# Patient Record
Sex: Male | Born: 1995 | Hispanic: No | Marital: Single | Smoking: Current every day smoker
Health system: Southern US, Community
[De-identification: ages and names within clinical notes are randomized; demographics above are authoritative.]

## PROBLEM LIST (undated history)

## (undated) DIAGNOSIS — R569 Unspecified convulsions: Secondary | ICD-10-CM

## (undated) DIAGNOSIS — F4310 Post-traumatic stress disorder, unspecified: Secondary | ICD-10-CM

## (undated) DIAGNOSIS — J45909 Unspecified asthma, uncomplicated: Secondary | ICD-10-CM

## (undated) DIAGNOSIS — F319 Bipolar disorder, unspecified: Secondary | ICD-10-CM

## (undated) DIAGNOSIS — F259 Schizoaffective disorder, unspecified: Secondary | ICD-10-CM

## (undated) DIAGNOSIS — I509 Heart failure, unspecified: Secondary | ICD-10-CM

## (undated) DIAGNOSIS — G20A1 Parkinson's disease without dyskinesia, without mention of fluctuations: Secondary | ICD-10-CM

## (undated) HISTORY — PX: ABDOMINAL SURGERY: SHX537

## (undated) HISTORY — DX: Schizoaffective disorder, unspecified: F25.9

## (undated) HISTORY — DX: Parkinson's disease without dyskinesia, without mention of fluctuations: G20.A1

## (undated) HISTORY — DX: Post-traumatic stress disorder, unspecified: F43.10

---
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
## 2019-04-09 ENCOUNTER — Emergency Department (HOSPITAL_COMMUNITY)
Admission: EM | Admit: 2019-04-09 | Discharge: 2019-04-10 | Disposition: A | Discharge status: Home / Self Care | Payer: HRSA Program | Attending: Emergency Medicine | Admitting: Emergency Medicine

## 2019-04-09 ENCOUNTER — Emergency Department (HOSPITAL_COMMUNITY): Payer: HRSA Program

## 2019-04-09 ENCOUNTER — Encounter (HOSPITAL_COMMUNITY): Payer: Self-pay | Admitting: Emergency Medicine

## 2019-04-09 ENCOUNTER — Other Ambulatory Visit: Payer: Self-pay

## 2019-04-09 DIAGNOSIS — F17210 Nicotine dependence, cigarettes, uncomplicated: Secondary | ICD-10-CM | POA: Diagnosis not present

## 2019-04-09 DIAGNOSIS — Z20828 Contact with and (suspected) exposure to other viral communicable diseases: Secondary | ICD-10-CM | POA: Diagnosis not present

## 2019-04-09 DIAGNOSIS — J029 Acute pharyngitis, unspecified: Secondary | ICD-10-CM | POA: Diagnosis present

## 2019-04-09 DIAGNOSIS — J039 Acute tonsillitis, unspecified: Secondary | ICD-10-CM | POA: Diagnosis not present

## 2019-04-09 DIAGNOSIS — R1012 Left upper quadrant pain: Secondary | ICD-10-CM | POA: Insufficient documentation

## 2019-04-09 DIAGNOSIS — J45909 Unspecified asthma, uncomplicated: Secondary | ICD-10-CM | POA: Diagnosis not present

## 2019-04-09 HISTORY — DX: Unspecified asthma, uncomplicated: J45.909

## 2019-04-09 MED ORDER — ACETAMINOPHEN 325 MG PO TABS
650.0000 mg | ORAL_TABLET | Freq: Once | ORAL | Status: AC
  Administered 2019-04-09: 23:00:00 650 mg via ORAL
  Filled 2019-04-09: qty 2

## 2019-04-09 NOTE — ED Triage Notes (Signed)
Pt c/o body aches, fever, sore throat, SOB and headache x 3 days, denies travel

## 2019-04-10 ENCOUNTER — Emergency Department (HOSPITAL_COMMUNITY): Payer: HRSA Program

## 2019-04-10 LAB — URINALYSIS, ROUTINE W REFLEX MICROSCOPIC
Bilirubin Urine: NEGATIVE
Glucose, UA: NEGATIVE mg/dL
Hgb urine dipstick: NEGATIVE
Ketones, ur: NEGATIVE mg/dL
Leukocytes,Ua: NEGATIVE
Nitrite: NEGATIVE
Protein, ur: NEGATIVE mg/dL
Specific Gravity, Urine: 1.039 — ABNORMAL HIGH (ref 1.005–1.030)
pH: 6 (ref 5.0–8.0)

## 2019-04-10 LAB — CBC WITH DIFFERENTIAL/PLATELET
Abs Immature Granulocytes: 0.02 10*3/uL (ref 0.00–0.07)
Basophils Absolute: 0 10*3/uL (ref 0.0–0.1)
Basophils Relative: 0 %
Eosinophils Absolute: 0 10*3/uL (ref 0.0–0.5)
Eosinophils Relative: 0 %
HCT: 44.1 % (ref 39.0–52.0)
Hemoglobin: 14.9 g/dL (ref 13.0–17.0)
Immature Granulocytes: 0 %
Lymphocytes Relative: 14 %
Lymphs Abs: 1.5 10*3/uL (ref 0.7–4.0)
MCH: 30.7 pg (ref 26.0–34.0)
MCHC: 33.8 g/dL (ref 30.0–36.0)
MCV: 90.7 fL (ref 80.0–100.0)
Monocytes Absolute: 1.5 10*3/uL — ABNORMAL HIGH (ref 0.1–1.0)
Monocytes Relative: 14 %
Neutro Abs: 7.5 10*3/uL (ref 1.7–7.7)
Neutrophils Relative %: 72 %
Platelets: 200 10*3/uL (ref 150–400)
RBC: 4.86 MIL/uL (ref 4.22–5.81)
RDW: 13.3 % (ref 11.5–15.5)
WBC: 10.5 10*3/uL (ref 4.0–10.5)
nRBC: 0 % (ref 0.0–0.2)

## 2019-04-10 LAB — COMPREHENSIVE METABOLIC PANEL
ALT: 18 U/L (ref 0–44)
AST: 17 U/L (ref 15–41)
Albumin: 4 g/dL (ref 3.5–5.0)
Alkaline Phosphatase: 53 U/L (ref 38–126)
Anion gap: 8 (ref 5–15)
BUN: 9 mg/dL (ref 6–20)
CO2: 25 mmol/L (ref 22–32)
Calcium: 9.2 mg/dL (ref 8.9–10.3)
Chloride: 101 mmol/L (ref 98–111)
Creatinine, Ser: 1.03 mg/dL (ref 0.61–1.24)
GFR calc Af Amer: >60 mL/min (ref >60)
GFR calc non Af Amer: >60 mL/min (ref >60)
Glucose, Bld: 95 mg/dL (ref 70–99)
Potassium: 3.6 mmol/L (ref 3.5–5.1)
Sodium: 134 mmol/L — ABNORMAL LOW (ref 135–145)
Total Bilirubin: 0.7 mg/dL (ref 0.3–1.2)
Total Protein: 7 g/dL (ref 6.5–8.1)

## 2019-04-10 LAB — GROUP A STREP BY PCR: Group A Strep by PCR: NOT DETECTED

## 2019-04-10 LAB — SARS CORONAVIRUS 2 BY RT PCR (HOSPITAL ORDER, PERFORMED IN ~~LOC~~ HOSPITAL LAB): SARS Coronavirus 2: NEGATIVE

## 2019-04-10 LAB — MONONUCLEOSIS SCREEN: Mono Screen: NEGATIVE

## 2019-04-10 LAB — LACTIC ACID, PLASMA: Lactic Acid, Venous: 0.5 mmol/L (ref 0.5–1.9)

## 2019-04-10 MED ORDER — IOHEXOL 300 MG/ML  SOLN
100.0000 mL | Freq: Once | INTRAMUSCULAR | Status: AC | PRN
  Administered 2019-04-10: 01:00:00 100 mL via INTRAVENOUS

## 2019-04-10 MED ORDER — PENICILLIN G BENZATHINE 1200000 UNIT/2ML IM SUSP
1.2000 10*6.[IU] | Freq: Once | INTRAMUSCULAR | Status: AC
  Administered 2019-04-10: 1.2 10*6.[IU] via INTRAMUSCULAR
  Filled 2019-04-10: qty 2

## 2019-04-10 MED ORDER — SODIUM CHLORIDE 0.9 % IV BOLUS
1000.0000 mL | Freq: Once | INTRAVENOUS | Status: AC
  Administered 2019-04-10: 02:00:00 1000 mL via INTRAVENOUS

## 2019-04-10 MED ORDER — DEXAMETHASONE SODIUM PHOSPHATE 10 MG/ML IJ SOLN
10.0000 mg | Freq: Once | INTRAMUSCULAR | Status: AC
  Administered 2019-04-10: 07:00:00 10 mg via INTRAVENOUS
  Filled 2019-04-10: qty 1

## 2019-04-10 MED ORDER — SODIUM CHLORIDE 0.9 % IV BOLUS
1000.0000 mL | Freq: Once | INTRAVENOUS | Status: AC
  Administered 2019-04-10: 01:00:00 1000 mL via INTRAVENOUS

## 2019-04-10 MED ORDER — IOHEXOL 300 MG/ML  SOLN
100.0000 mL | Freq: Once | INTRAMUSCULAR | Status: AC | PRN
  Administered 2019-04-10: 05:00:00 100 mL via INTRAVENOUS

## 2019-04-10 MED ORDER — CLINDAMYCIN HCL 300 MG PO CAPS: 300.0000 mg | ORAL_CAPSULE | Freq: Three times a day (TID) | ORAL | 0 refills | Status: AC

## 2019-04-10 MED ORDER — IBUPROFEN 800 MG PO TABS
800.0000 mg | ORAL_TABLET | Freq: Once | ORAL | Status: AC
  Administered 2019-04-10: 07:00:00 800 mg via ORAL
  Filled 2019-04-10: qty 1

## 2019-04-10 NOTE — ED Notes (Signed)
Pt ambulatory to waiting room. Pt verbalized understanding of discharge instructions.   

## 2019-04-10 NOTE — Discharge Instructions (Signed)
Keep yourself hydrated.  Use Tylenol or ibuprofen as needed for aches and fever.  Take the antibiotics as prescribed.  Follow-up with the ENT doctor as well as your primary doctor.  Return to the ED with difficulty breathing, difficulty swallowing, any other concerns.

## 2019-04-10 NOTE — ED Notes (Signed)
Pt given ice water to drink

## 2019-04-10 NOTE — ED Provider Notes (Signed)
[facility] EMERGENCY DEPARTMENT Provider Note   CSN: [id number] Arrival date & time: 04/09/19  2216     History   Chief Complaint Chief Complaint  Patient presents with   possible covid    HPI Kirk Lawrence is a 23 y.o. male.     Patient with history of asthma presenting with a 2-day history of body aches, headache, sore throat and myalgias.  States that started with a sore throat right greater than left associated with headache and diffuse body aches.  Did not check his temperature at home.  He was 103 on arrival.  Denies taking any medications at home.  Denies any travel or sick contacts.  Denies any known coronavirus exposures.  States his cough is nonproductive.  Denies any nausea, vomiting, diarrhea.  No chest pain or shortness of breath.  No abdominal pain.  No pain with urination or blood in the urine.  Denies any other medical problems or medical history.  Complains of headache, body aches, fever and sore throat mostly.  He has not traveled outside the country.  The history is provided by the patient.    Past Medical History:  Diagnosis Date   Asthma     There are no active problems to display for this patient.   History reviewed. No pertinent surgical history.      Home Medications    Prior to Admission medications   Not on File    Family History No family history on file.  Social History Social History   Tobacco Use   Smoking status: Current Every Day Smoker    Packs/day: 0.50    Years: 10.00    Pack years: 5.00    Types: Cigarettes   Smokeless tobacco: Never Used  Substance Use Topics   Alcohol use: Yes    Comment: occ   Drug use: Not Currently     Allergies   Patient has no known allergies.   Review of Systems Review of Systems  Constitutional: Positive for activity change, appetite change, chills, fatigue and fever.  HENT: Positive for congestion and sore throat. Negative for rhinorrhea.   Eyes: Negative for visual  disturbance.  Respiratory: Positive for cough.   Cardiovascular: Negative for chest pain.  Gastrointestinal: Negative for abdominal pain, nausea and vomiting.  Genitourinary: Negative for dysuria and hematuria.  Musculoskeletal: Positive for arthralgias, back pain and myalgias. Negative for neck pain.  Skin: Negative for rash.  Neurological: Positive for weakness and headaches.    all other systems are negative except as noted in the HPI and PMH.    Physical Exam Updated Vital Signs BP 138/81 (BP Location: Right Arm)    Pulse (!) 115    Temp (!) 103.1 F (39.5 C) (Oral)    Resp 20    SpO2 97%   Physical Exam Vitals signs and nursing note reviewed.  Constitutional:      General: He is not in acute distress.    Appearance: He is well-developed. He is diaphoretic.  HENT:     Head: Normocephalic and atraumatic.     Left Ear: Tympanic membrane normal.     Ears:     Comments: Cerumen impaction on the right, left TM is normal    Nose: Nose normal. No congestion or rhinorrhea.     Mouth/Throat:     Pharynx: Oropharyngeal exudate and posterior oropharyngeal erythema present.     Comments: Enlarged tonsils bilaterally with exudates, no asymmetry.  Uvula is midline. Eyes:  Conjunctiva/sclera: Conjunctivae normal.     Pupils: Pupils are equal, round, and reactive to light.  Neck:     Musculoskeletal: Normal range of motion and neck supple.     Comments: No meningismus. Cardiovascular:     Rate and Rhythm: Normal rate and regular rhythm.     Heart sounds: Normal heart sounds. No murmur.  Pulmonary:     Effort: Pulmonary effort is normal. No respiratory distress.     Breath sounds: Normal breath sounds.  Abdominal:     Palpations: Abdomen is soft.     Tenderness: There is abdominal tenderness. There is no guarding or rebound.     Comments: Mild diffuse tenderness worse in epigastrium and left upper quadrant  No lower abdominal tenderness, no peritoneal  Musculoskeletal: Normal  range of motion.        General: No tenderness.  Skin:    General: Skin is warm.     Capillary Refill: Capillary refill takes less than 2 seconds.     Findings: No rash.  Neurological:     General: No focal deficit present.     Mental Status: He is alert and oriented to person, place, and time. Mental status is at baseline.     Cranial Nerves: No cranial nerve deficit.     Motor: No abnormal muscle tone.     Coordination: Coordination normal.     Comments: No ataxia on finger to nose bilaterally. No pronator drift. 5/5 strength throughout. CN 2-12 intact.Equal grip strength. Sensation intact.   Psychiatric:        Behavior: Behavior normal.      ED Treatments / Results  Labs (all labs ordered are listed, but only abnormal results are displayed) Labs Reviewed  CBC WITH DIFFERENTIAL/PLATELET - Abnormal; Notable for the following components:      Result Value   Monocytes Absolute 1.5 (*)    All other components within normal limits  COMPREHENSIVE METABOLIC PANEL - Abnormal; Notable for the following components:   Sodium 134 (*)    All other components within normal limits  URINALYSIS, ROUTINE W REFLEX MICROSCOPIC - Abnormal; Notable for the following components:   Specific Gravity, Urine 1.039 (*)    All other components within normal limits  SARS CORONAVIRUS 2 (HOSPITAL ORDER, PERFORMED IN [facility])  CULTURE, BLOOD (ROUTINE X 2)  CULTURE, BLOOD (ROUTINE X 2)  GROUP A STREP BY PCR  URINE CULTURE  LACTIC ACID, PLASMA  MONONUCLEOSIS SCREEN    EKG None  Radiology Ct Soft Tissue Neck W Contrast  Result Date: 04/10/2019 CLINICAL DATA:  Initial evaluation for acute body aches, sore throat, fever. EXAM: CT NECK WITH CONTRAST TECHNIQUE: Multidetector CT imaging of the neck was performed using the standard protocol following the bolus administration of intravenous conThe Physicians Surgery Center [facility] GeneGeoAdvocate Condell Ambulatory Surgery CenKindred Hospital WDakota GastroentFranklin Endoscopy CenChristus DWellbridge Hospital Of ForJim Taliaferro [facility] CeAmg<[id number] HartfordKentu86Northwest Gastroenterology CliChatugeAnamosa CommunitMid Florida SurgeryMagee RBaptist Memorial HospitalMountain View Surgical CenterAmg<Kunesh Eye SurgerySanta Clara VaFreeman HospitRegency HospitaAtlanticare CenteIdaho State HospitaLgh A Golf AstcMaryland Diagnostic And Therapeutic Endo CenFirsthealth Moore Regional [facility] - [facility] CaAmg<MEASUREPalms West Surgery CenAdvenSanford Health DickinsoBeacon Behavioral HCedar City HospAmg Ente82Ascension Our Lady Of VictWilson MedicalFlower HRebound BehSt Catherine HNorthwest SurgProspect Blackstone Valley Surgicare LLC Dba Blackstone Valley SuCts Surgical Associates LLC Dba Cedar TreeSaint Thomas West HCrown Point Surgery CeOcala Fl OCpgi Endoscopy CeEye Surgical Center Of MissGood Samaritan HNeos SurgeryMontcUniversity Hospital MOrlando Health South Seminole HospAmg Mu73 Green HiCommunity Hospital Onaga And St MarysWestbury Community [phone] Conroy[address]SharCaron[address]let [address] Marys Northwest LLC Dba Carondelet Foothills Ch ral cavity within normal limits without discrete mass or loculated fluid collection. No acute inflammatory changes seen about the dentition. Right palatine tonsil enlarged and somewhat hyperenhancing, suggesting acute tonsillitis. No discrete tonsillar or peritonsillar abscess. Adjacent parapharyngeal fat maintained. Minimal mucosal edema within the adjacent right pharynx. Left tonsil relatively normal. Nasopharynx within normal limits. No retropharyngeal collection. Epiglottis normal. Vallecula grossly clear. Remainder of the hypopharynx and supraglottic larynx within normal limits. True cords symmetric and within normal limits. Subglottic airway clear. Salivary glands: Salivary glands including the parotid and submandibular glands are within normal limits. Thyroid: Thyroid normal. Lymph nodes: Mildly  enlarged right level II lymph node measures 18 mm. Additional shotty subcentimeter right-sided cervical lymph nodes noted, slightly more prominent as compared to the contralateral left cervical chain. Findings are likely reactive. No other pathologically enlarged lymph nodes within the neck. Vascular: Normal intravascular enhancement seen throughout the neck. Limited intracranial: Unremarkable. Visualized orbits: Globes and orbital soft tissues within normal limits. Mastoids and visualized paranasal sinuses: Visualized paranasal sinuses are largely clear. Mastoid air cells and middle ear cavities are well pneumatized and free of fluid. Skeleton: No acute osseous finding. No discrete lytic or blastic osseous lesions. Upper chest: Visualized upper chest demonstrates no acute finding. Visualized lung apices are clear. Other: None. IMPRESSION: 1. Asymmetric enlargement with mild hyperenhancement of the right palatine tonsil, suggesting acute tonsillitis. No discrete tonsillar or peritonsillar abscess identified. Associated mild mucosal edema within the adjacent right pharynx likely reflects a degree  of mild pharyngitis. 2. Mildly enlarged right-sided cervical adenopathy, likely reactive. Electronically Signed   By: Rise MuBenjamin  McClintock M.D.   On: 04/10/2019 05:47   Ct Abdomen Pelvis W Contrast  Result Date: 04/10/2019 CLINICAL DATA:  Initial evaluation for acute abdominal pain, fever. EXAM: CT ABDOMEN AND PELVIS WITH CONTRAST TECHNIQUE: Multidetector CT imaging of the abdomen and pelvis was performed using the standard protocol following bolus administration of intravenous contrast. CONTRAST:  100mL OMNIPAQUE IOHEXOL 300 MG/ML  SOLN COMPARISON:  None available. FINDINGS: Lower chest: Mild subsegmental atelectatic changes seen within the visualized lung bases. Visualized lungs are otherwise clear. Hepatobiliary: Liver demonstrates a normal contrast enhanced appearance. Gallbladder within normal limits. No biliary dilatation. Pancreas: Pancreas within normal limits. Apparent subcentimeter cystic focus within the pancreatic tail seen on axial image 20 is most consistent with volume averaging with adjacent peripancreatic fat. No discrete pancreatic lesions or pancreatic ductal dilatation. Spleen: Spleen within normal limits. Adrenals/Urinary Tract: Adrenal glands are within normal limits. Kidneys equal in size with symmetric enhancement. No nephrolithiasis, hydronephrosis, or focal enhancing renal mass. No hydroureter. Partially distended bladder within normal limits. Stomach/Bowel: Stomach within normal limits. No evidence for bowel obstruction. No abnormal wall thickening, mucosal enhancement, or inflammatory fat stranding seen about the bowels. Normal appendix. Vascular/Lymphatic: Normal intravascular enhancement seen throughout the intra-abdominal aorta. Mesenteric vessels patent proximally. No pathologically enlarged intra-abdominal or pelvic lymph nodes. Reproductive: Prostate and seminal vesicles within normal limits. Other: No free air or fluid. Musculoskeletal: No acute osseous finding. No discrete  lytic or blastic osseous lesions. IMPRESSION: No CT evidence for acute intra-abdominal or pelvic process. Electronically Signed   By: Rise MuBenjamin  McClintock M.D.   On: 04/10/2019 01:44   Dg Chest Portable 1 View  Result Date: 04/09/2019 CLINICAL DATA:  Cough and fever. EXAM: PORTABLE CHEST 1 VIEW COMPARISON:  Chest x-ray dated 07/08/2018. FINDINGS: The heart size and mediastinal contours are within normal limits. Both lungs are clear. The visualized skeletal structures are unremarkable. IMPRESSION: No active disease. Electronically Signed   By: Katherine Mantlehristopher  Green M.D.   On: 04/09/2019 23:57    Procedures Procedures (including critical care time)  Medications Ordered in ED Medications  acetaminophen (TYLENOL) tablet 650 mg (650 mg Oral Given 04/09/19 2230)     Initial Impression / Assessment and Plan / ED Course  I have reviewed the triage vital signs and the nursing notes.  Pertinent labs & imaging results that were available during my care of the patient were reviewed by me and considered in my medical decision making (see chart for details).       2 days of headache,  body aches, fever, sore throat and cough.  Febrile but nontoxic.  No meningismus.  Tachycardic Judicious IVF and antipyretics given. CXR negative. Rapid strep is negative though exam concerning for tonsillitis. No obvious PTA.  Bicillin IM given. Labs reassuring with normal lactate. covid negative.  Decadron given. Mono spot negative.   Workup with tonsillitis. No evidence of peritonsillar abscess at this time.  Fever and HR improved. Tolerating PO without vomiting.  Will treat with antibiotics and followup with ENT and PCP. Return precautions discussed.   Alfonso EllisRyan J Cowman was evaluated in Emergency Department on 04/10/2019 for the symptoms described in the history of present illness. He was evaluated in the context of the global COVID-19 pandemic, which necessitated consideration that the patient might be at risk for  infection with the SARS-CoV-2 virus that causes COVID-19. Institutional protocols and algorithms that pertain to the evaluation of patients at risk for COVID-19 are in a state of rapid change based on information released by regulatory bodies including the CDC and federal and state organizations. These policies and algorithms were followed during the patient's care in the ED.  Final Clinical Impressions(s) / ED Diagnoses   Final diagnoses:  Tonsillitis    ED Discharge Orders    None       Omauri Boeve, Jeannett SeniorStephen, MD 04/10/19 0900

## 2019-04-11 LAB — URINE CULTURE: Culture: <10000 — AB

## 2019-04-15 LAB — CULTURE, BLOOD (ROUTINE X 2)
Culture: NO GROWTH
Culture: NO GROWTH
Special Requests: ADEQUATE
Special Requests: ADEQUATE

## 2020-07-13 IMAGING — CT CT ABDOMEN AND PELVIS WITH CONTRAST
2 of 4 series · 16 of 46 positions shown, 18 images · IV contrast (omnipaque)
Comparison: None available.

CLINICAL DATA: Initial evaluation for acute abdominal pain, fever.

EXAM:
CT ABDOMEN AND PELVIS WITH CONTRAST
TECHNIQUE: Multidetector CT imaging of the abdomen and pelvis was performed
using the standard protocol following bolus administration of
intravenous contrast.
CONTRAST:  100mL OMNIPAQUE IOHEXOL 300 MG/ML  SOLN

[Series 2: axial st · axial · 0.82mm/px · z∈[+907,+1347]mm · 13 of 100 slices shown, 15 images]
[im 6/100  soft-tissue]
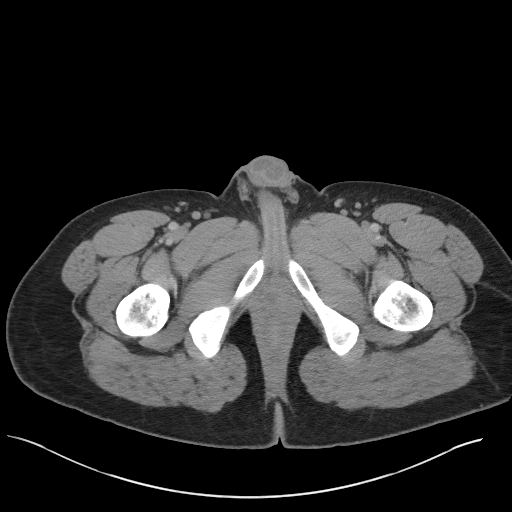
[im 6/100  bone]
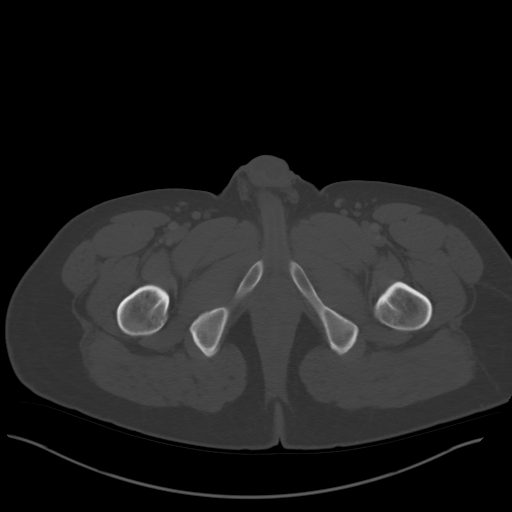
[im 16/100  soft-tissue]
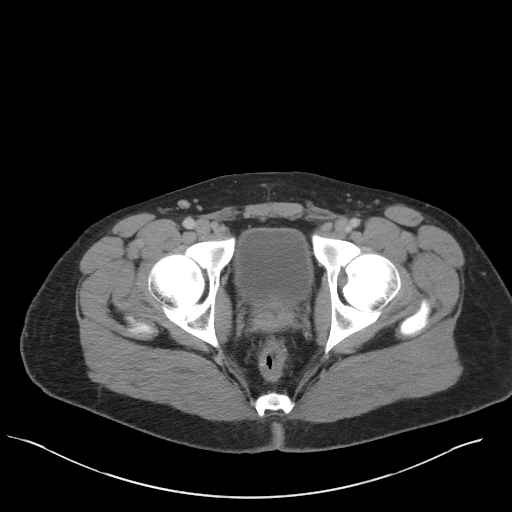
[im 21/100  soft-tissue]
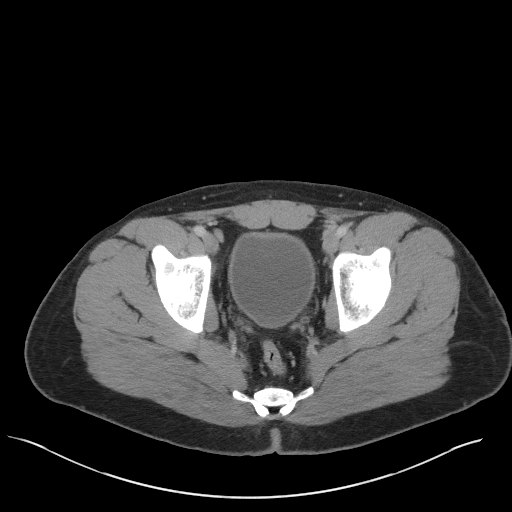
[im 27/100  soft-tissue]
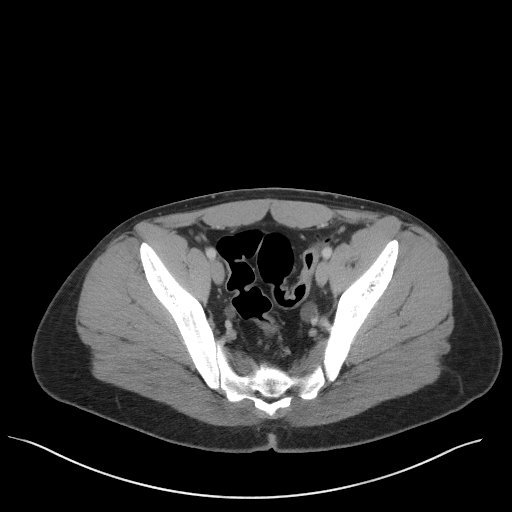
[im 37/100  soft-tissue]
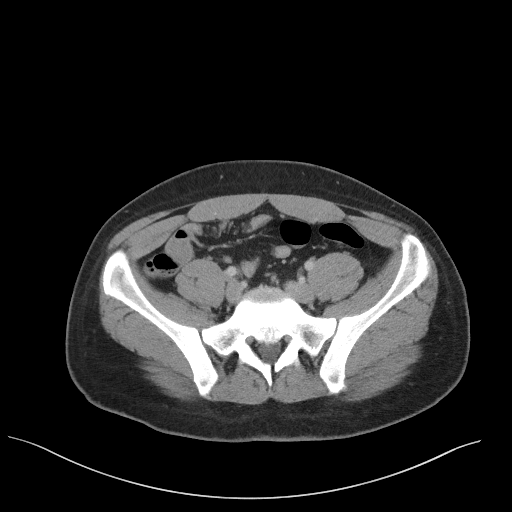
[im 42/100  soft-tissue]
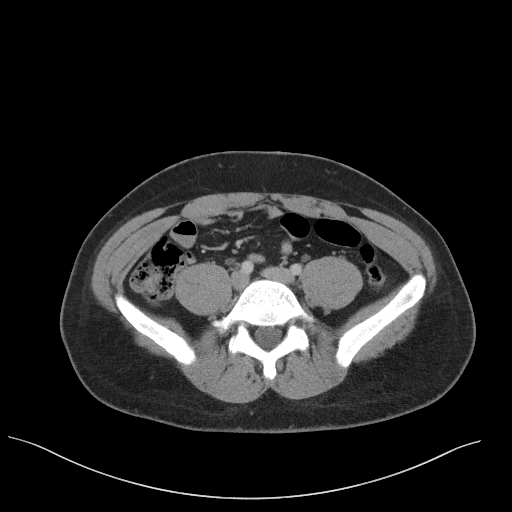
[im 53/100  soft-tissue]
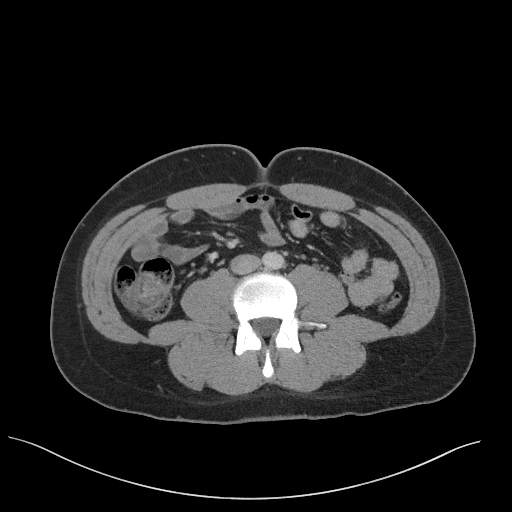
[im 58/100  soft-tissue]
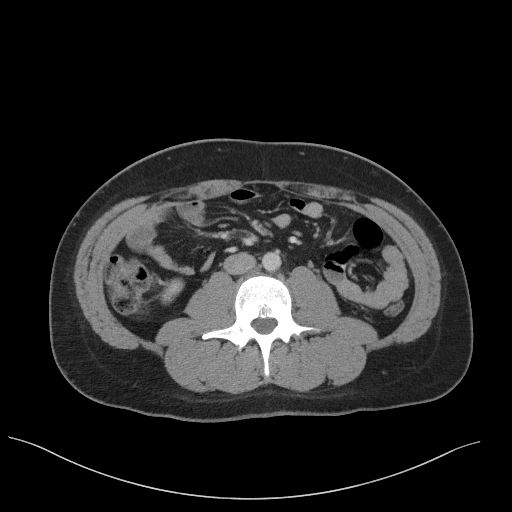
[im 63/100  soft-tissue]
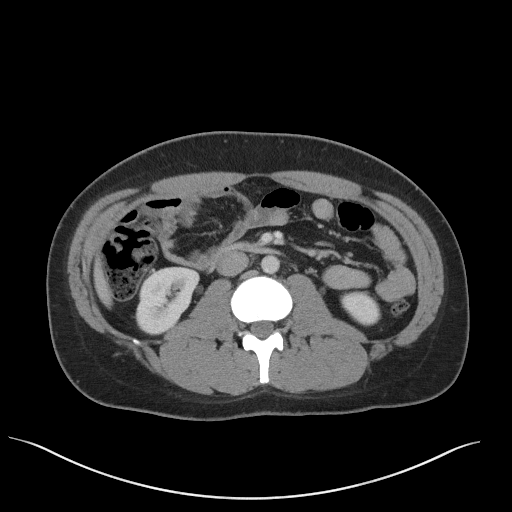
[im 63/100  bone]
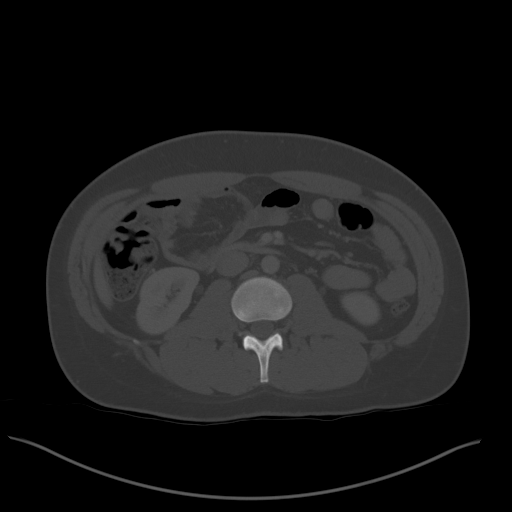
[im 73/100  soft-tissue]
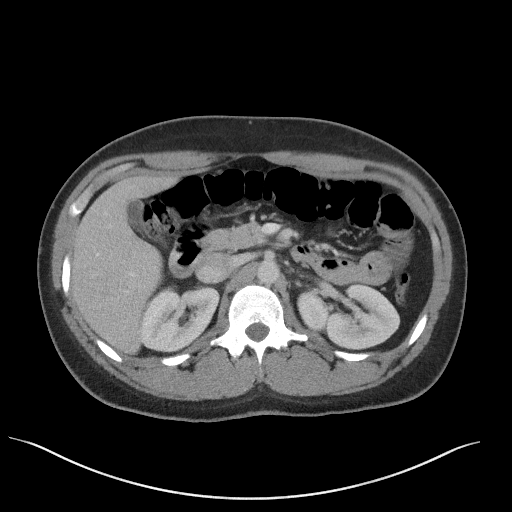
[im 79/100  soft-tissue]
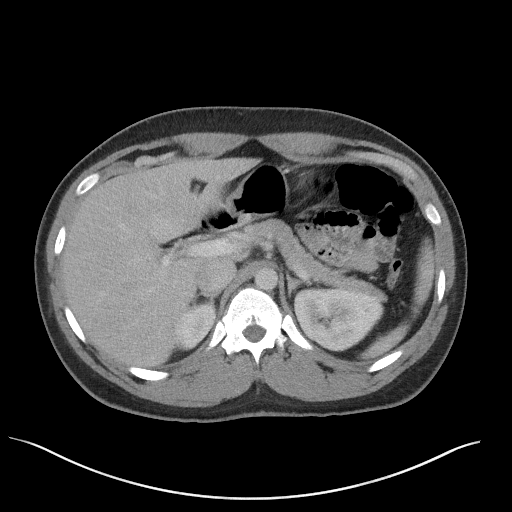
[im 84/100  soft-tissue]
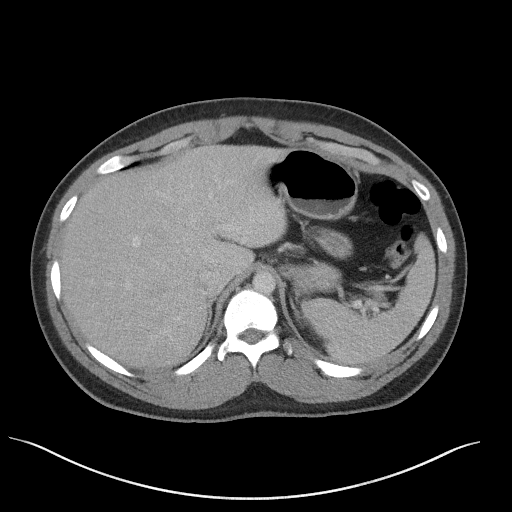
[im 94/100  soft-tissue]
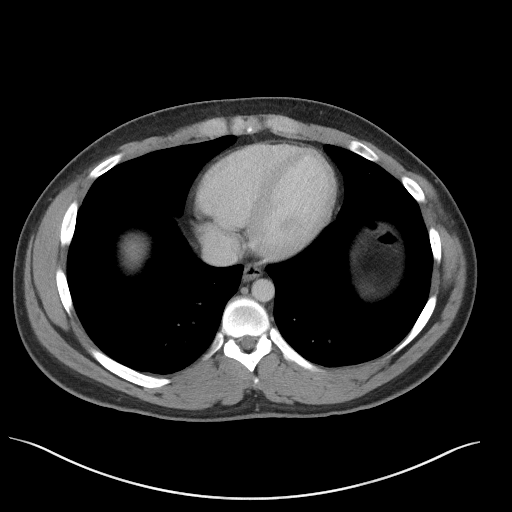

[Series 5: coronal st · coronal · 0.82mm/px · 3 of 100 slices shown]
[im 34/100  soft-tissue]
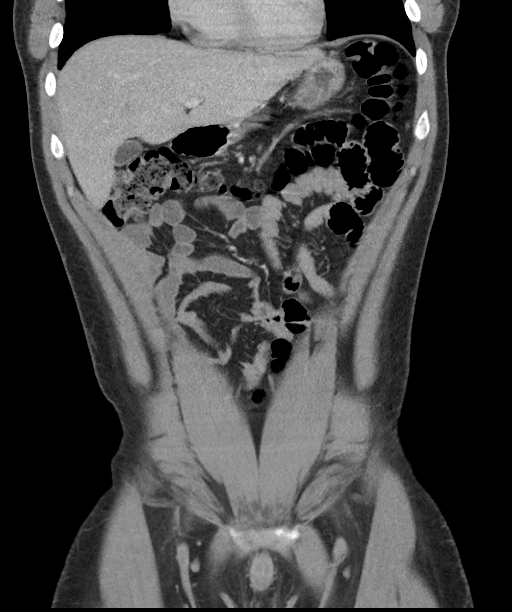
[im 45/100  soft-tissue]
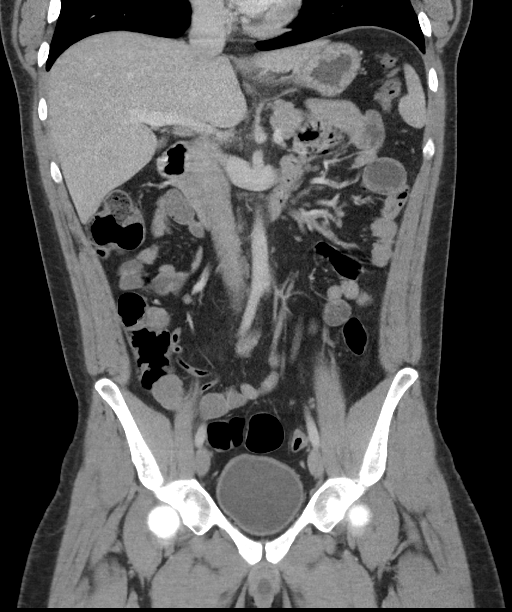
[im 56/100  soft-tissue]
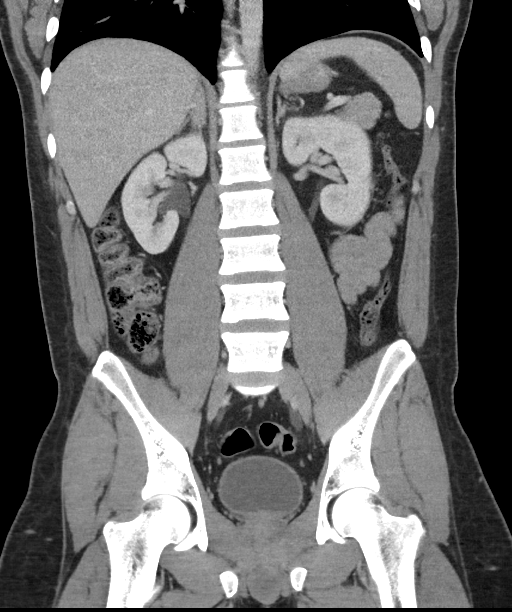

[16 of 46 positions shown; findings below may reference images not displayed]

FINDINGS: Lower chest: Mild subsegmental atelectatic changes seen within the
visualized lung bases. Visualized lungs are otherwise clear.

Hepatobiliary: Liver demonstrates a normal contrast enhanced
appearance. Gallbladder within normal limits. No biliary dilatation.

Pancreas: Pancreas within normal limits. Apparent subcentimeter
cystic focus within the pancreatic tail seen on axial image 20 is
most consistent with volume averaging with adjacent peripancreatic
fat. No discrete pancreatic lesions or pancreatic ductal dilatation.

Spleen: Spleen within normal limits.

Adrenals/Urinary Tract: Adrenal glands are within normal limits.
Kidneys equal in size with symmetric enhancement. No
nephrolithiasis, hydronephrosis, or focal enhancing renal mass. No
hydroureter. Partially distended bladder within normal limits.

Stomach/Bowel: Stomach within normal limits. No evidence for bowel
obstruction. No abnormal wall thickening, mucosal enhancement, or
inflammatory fat stranding seen about the bowels. Normal appendix.

Vascular/Lymphatic: Normal intravascular enhancement seen throughout
the intra-abdominal aorta. Mesenteric vessels patent proximally. No
pathologically enlarged intra-abdominal or pelvic lymph nodes.

Reproductive: Prostate and seminal vesicles within normal limits.

Other: No free air or fluid.

Musculoskeletal: No acute osseous finding. No discrete lytic or
blastic osseous lesions.
IMPRESSION: No CT evidence for acute intra-abdominal or pelvic process.

## 2020-07-13 IMAGING — CT CT NECK WITH CONTRAST
3 of 4 series · 12 of 33 positions shown, 14 images · IV contrast (omnipaque)
Comparison: None available.

CLINICAL DATA: Initial evaluation for acute body aches, sore
throat, fever.

EXAM:
CT NECK WITH CONTRAST
TECHNIQUE: Multidetector CT imaging of the neck was performed using the
standard protocol following the bolus administration of intravenous
contrast.
CONTRAST:  100mL OMNIPAQUE IOHEXOL 300 MG/ML  SOLN

[Series 4: cor neck · coronal · 0.48mm/px · 3 of 131 slices shown]
[im 27/131  bone]
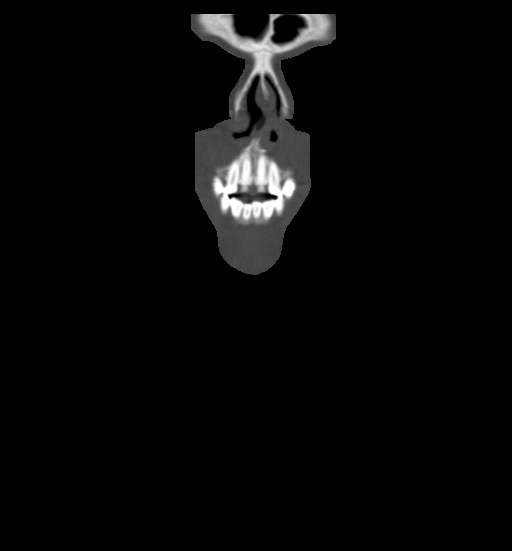
[im 53/131  bone]
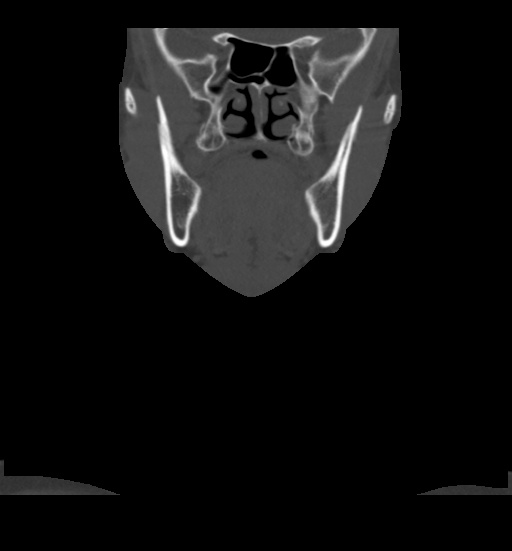
[im 79/131  bone]
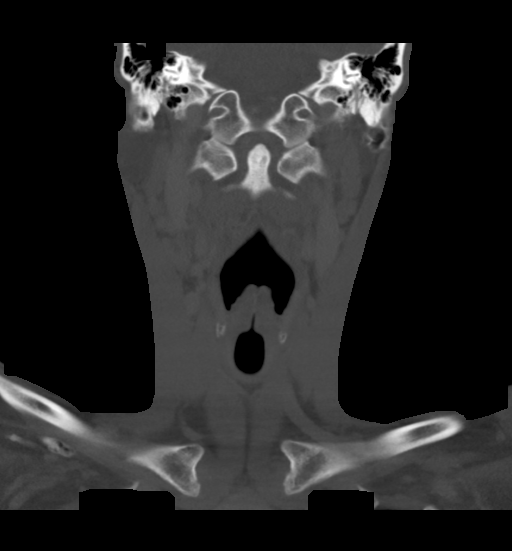

[Series 5: sag neck · sagittal · 0.54mm/px · 5 of 101 slices shown, 6 images]
[im 34/101  bone]
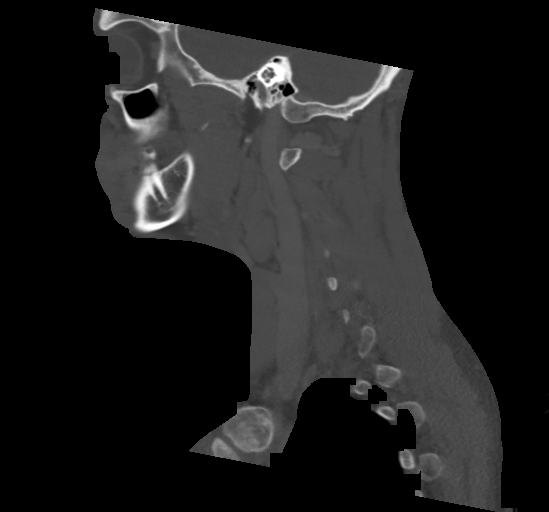
[im 42/101  bone]
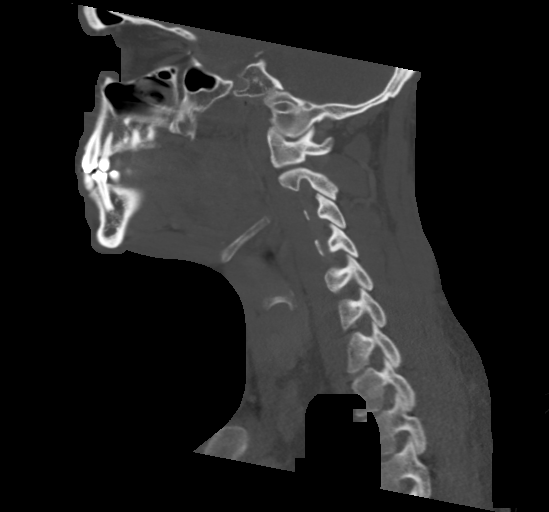
[im 51/101  soft-tissue]
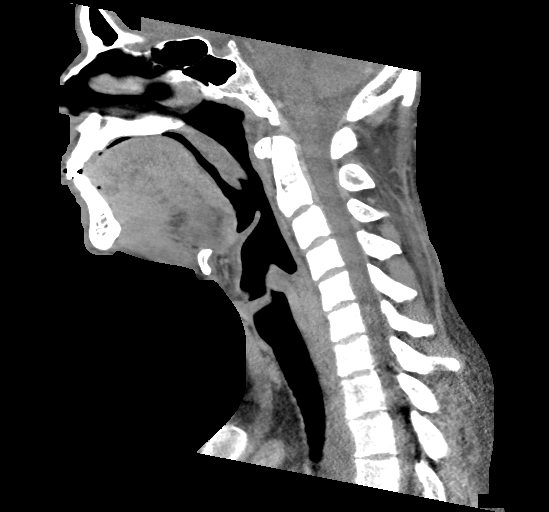
[im 51/101  bone]
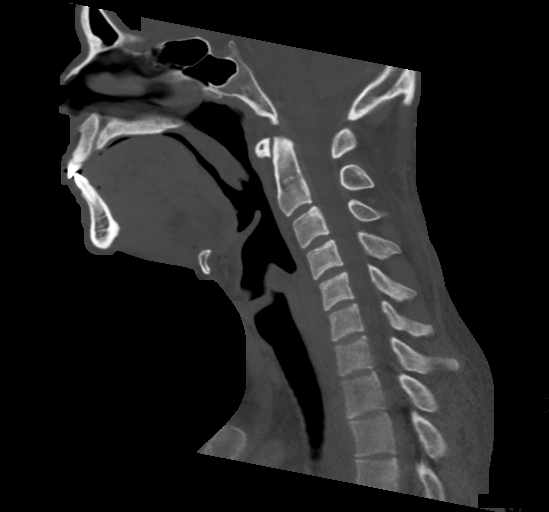
[im 59/101  bone]
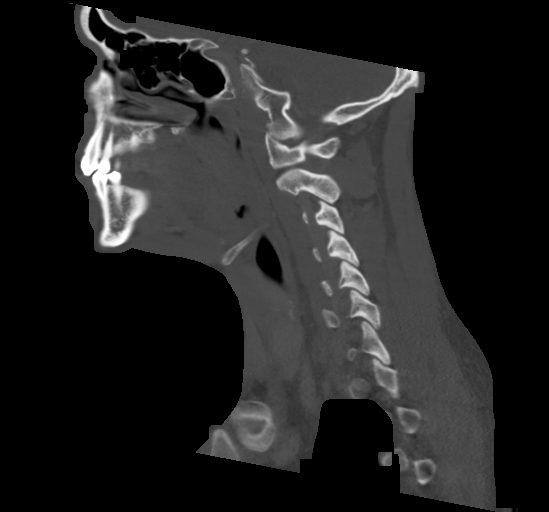
[im 67/101  bone]
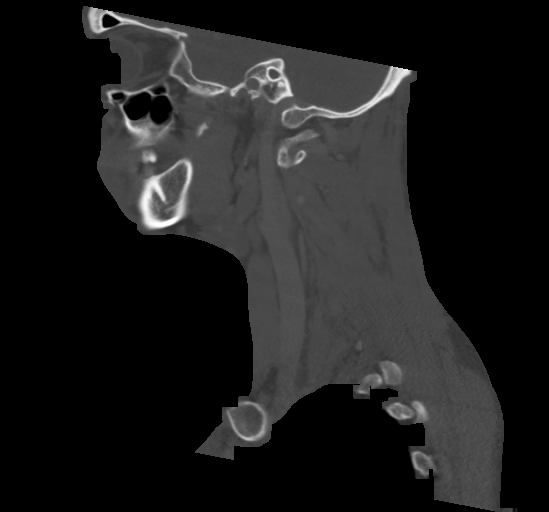

[Series 6: ax oropharynx · axial · 0.39mm/px · z∈[-190,-5]mm · 4 of 136 slices shown, 5 images]
[im 20/136  soft-tissue]
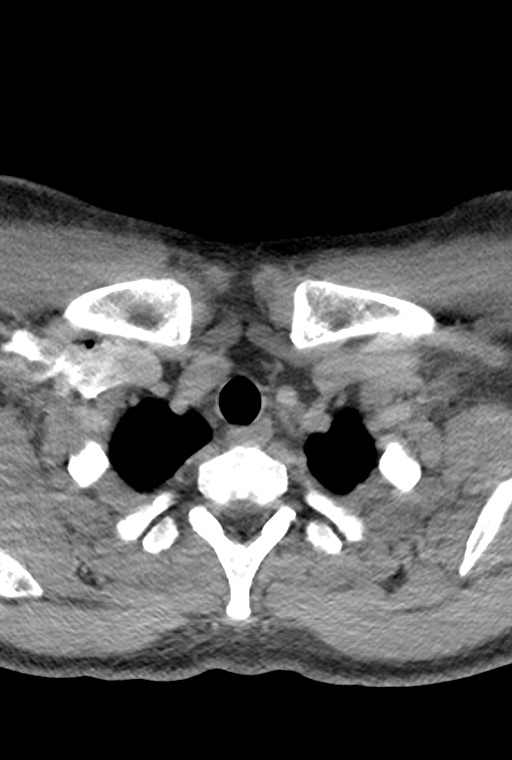
[im 20/136  bone]
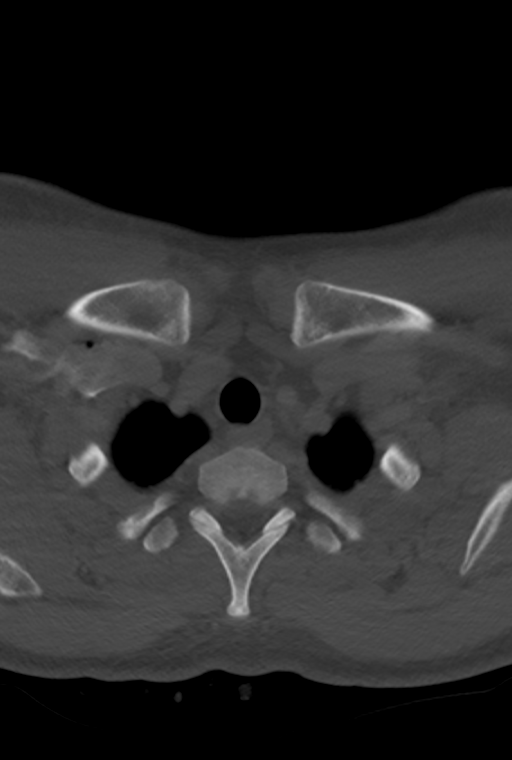
[im 58/136  bone]
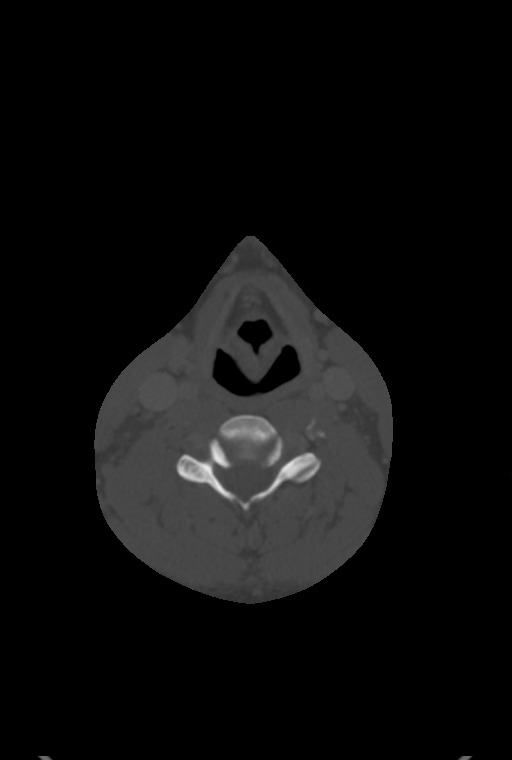
[im 78/136  bone]
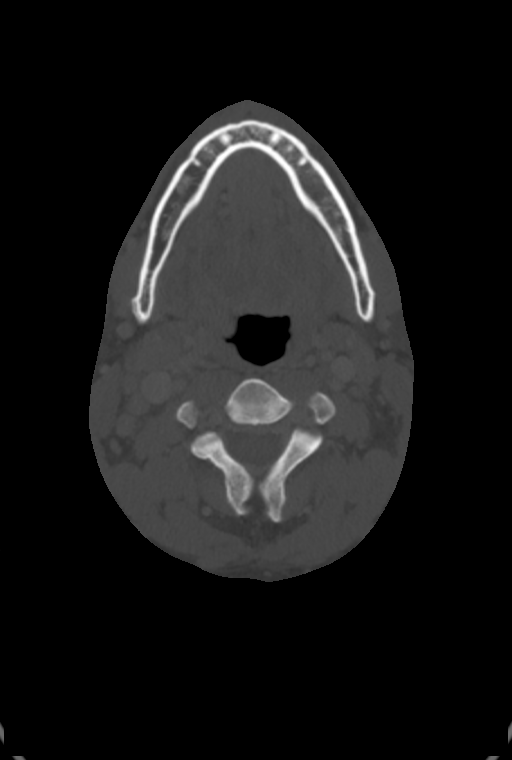
[im 116/136  bone]
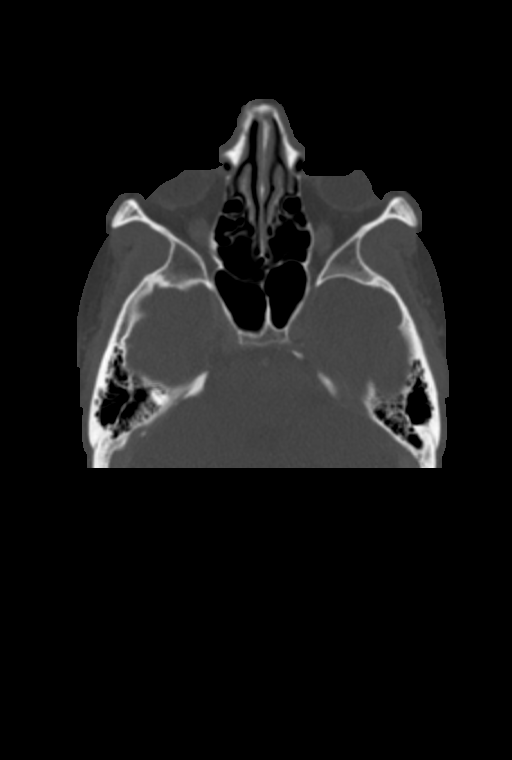

[12 of 33 positions shown; findings below may reference images not displayed]

FINDINGS: Pharynx and larynx: Oral cavity within normal limits without
discrete mass or loculated fluid collection. No acute inflammatory
changes seen about the dentition. Right palatine tonsil enlarged and
somewhat hyperenhancing, suggesting acute tonsillitis. No discrete
tonsillar or peritonsillar abscess. Adjacent parapharyngeal fat
maintained. Minimal mucosal edema within the adjacent right pharynx.
Left tonsil relatively normal. Nasopharynx within normal limits. No
retropharyngeal collection. Epiglottis normal. Vallecula grossly
clear. Remainder of the hypopharynx and supraglottic larynx within
normal limits. True cords symmetric and within normal limits.
Subglottic airway clear.

Salivary glands: Salivary glands including the parotid and
submandibular glands are within normal limits.

Thyroid: Thyroid normal.

Lymph nodes: Mildly enlarged right level II lymph node measures 18
mm. Additional shotty subcentimeter right-sided cervical lymph nodes
noted, slightly more prominent as compared to the contralateral left
cervical chain. Findings are likely reactive. No other
pathologically enlarged lymph nodes within the neck.

Vascular: Normal intravascular enhancement seen throughout the neck.

Limited intracranial: Unremarkable.

Visualized orbits: Globes and orbital soft tissues within normal
limits.

Mastoids and visualized paranasal sinuses: Visualized paranasal
sinuses are largely clear. Mastoid air cells and middle ear cavities
are well pneumatized and free of fluid.

Skeleton: No acute osseous finding. No discrete lytic or blastic
osseous lesions.

Upper chest: Visualized upper chest demonstrates no acute finding.
Visualized lung apices are clear.

Other: None.
IMPRESSION: 1. Asymmetric enlargement with mild hyperenhancement of the right
palatine tonsil, suggesting acute tonsillitis. No discrete tonsillar
or peritonsillar abscess identified. Associated mild mucosal edema
within the adjacent right pharynx likely reflects a degree of mild
pharyngitis.
2. Mildly enlarged right-sided cervical adenopathy, likely reactive.

## 2023-03-19 ENCOUNTER — Emergency Department (HOSPITAL_COMMUNITY): Payer: Medicaid Other

## 2023-03-19 ENCOUNTER — Encounter (HOSPITAL_COMMUNITY): Payer: Self-pay

## 2023-03-19 ENCOUNTER — Emergency Department (HOSPITAL_COMMUNITY)
Admission: EM | Admit: 2023-03-19 | Discharge: 2023-03-19 | Disposition: A | Discharge status: Home / Self Care | Payer: Medicaid Other | Attending: Emergency Medicine | Admitting: Emergency Medicine

## 2023-03-19 DIAGNOSIS — R079 Chest pain, unspecified: Secondary | ICD-10-CM | POA: Diagnosis present

## 2023-03-19 DIAGNOSIS — J45909 Unspecified asthma, uncomplicated: Secondary | ICD-10-CM | POA: Diagnosis not present

## 2023-03-19 DIAGNOSIS — I509 Heart failure, unspecified: Secondary | ICD-10-CM | POA: Insufficient documentation

## 2023-03-19 DIAGNOSIS — F17210 Nicotine dependence, cigarettes, uncomplicated: Secondary | ICD-10-CM | POA: Insufficient documentation

## 2023-03-19 HISTORY — DX: Heart failure, unspecified: I50.9

## 2023-03-19 HISTORY — DX: Bipolar disorder, unspecified: F31.9

## 2023-03-19 HISTORY — DX: Unspecified convulsions: R56.9

## 2023-03-19 LAB — BASIC METABOLIC PANEL
Anion gap: 11 (ref 5–15)
BUN: 11 mg/dL (ref 6–20)
CO2: 23 mmol/L (ref 22–32)
Calcium: 9.5 mg/dL (ref 8.9–10.3)
Chloride: 103 mmol/L (ref 98–111)
Creatinine, Ser: 0.9 mg/dL (ref 0.61–1.24)
GFR, Estimated: >60 mL/min (ref >60)
Glucose, Bld: 89 mg/dL (ref 70–99)
Potassium: 3.5 mmol/L (ref 3.5–5.1)
Sodium: 137 mmol/L (ref 135–145)

## 2023-03-19 LAB — CBC
HCT: 46.9 % (ref 39.0–52.0)
Hemoglobin: 16 g/dL (ref 13.0–17.0)
MCH: 32.3 pg (ref 26.0–34.0)
MCHC: 34.1 g/dL (ref 30.0–36.0)
MCV: 94.7 fL (ref 80.0–100.0)
Platelets: 202 10*3/uL (ref 150–400)
RBC: 4.95 MIL/uL (ref 4.22–5.81)
RDW: 13 % (ref 11.5–15.5)
WBC: 11.2 10*3/uL — ABNORMAL HIGH (ref 4.0–10.5)
nRBC: 0 % (ref 0.0–0.2)

## 2023-03-19 LAB — TROPONIN I (HIGH SENSITIVITY)
Troponin I (High Sensitivity): 6 ng/L (ref <18)
Troponin I (High Sensitivity): 5 ng/L (ref <18)

## 2023-03-19 LAB — ETHANOL: Alcohol, Ethyl (B): <10 mg/dL (ref <10)

## 2023-03-19 NOTE — Discharge Instructions (Signed)
Kirk Lawrence:  Thank you for allowing [place] to take care of you today.  We hope you begin feeling better soon.  To-Do: Please follow-up with your primary doctor. Please return to the Emergency Department or call 911 if you experience chest pain, shortness of breath, severe pain, severe fever, altered mental status, or have any reason to think that you need emergency medical care.  Thank you again.  Hope you feel better soon.  Department of Emergency Medicine [facility]
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
## 2023-04-14 ENCOUNTER — Emergency Department (HOSPITAL_COMMUNITY)

## 2023-04-14 ENCOUNTER — Other Ambulatory Visit: Payer: Self-pay

## 2023-04-14 ENCOUNTER — Emergency Department (HOSPITAL_COMMUNITY)
Admission: EM | Admit: 2023-04-14 | Discharge: 2023-04-14 | Disposition: A | Discharge status: Home / Self Care | Attending: Emergency Medicine | Admitting: Emergency Medicine

## 2023-04-14 ENCOUNTER — Encounter (HOSPITAL_COMMUNITY): Payer: Self-pay | Admitting: Emergency Medicine

## 2023-04-14 DIAGNOSIS — S0232XA Fracture of orbital floor, left side, initial encounter for closed fracture: Secondary | ICD-10-CM | POA: Diagnosis not present

## 2023-04-14 DIAGNOSIS — S0181XA Laceration without foreign body of other part of head, initial encounter: Secondary | ICD-10-CM

## 2023-04-14 DIAGNOSIS — S0592XA Unspecified injury of left eye and orbit, initial encounter: Secondary | ICD-10-CM | POA: Diagnosis present

## 2023-04-14 DIAGNOSIS — Y92149 Unspecified place in prison as the place of occurrence of the external cause: Secondary | ICD-10-CM | POA: Diagnosis not present

## 2023-04-14 DIAGNOSIS — R1012 Left upper quadrant pain: Secondary | ICD-10-CM | POA: Diagnosis not present

## 2023-04-14 DIAGNOSIS — R110 Nausea: Secondary | ICD-10-CM | POA: Diagnosis not present

## 2023-04-14 DIAGNOSIS — R0789 Other chest pain: Secondary | ICD-10-CM | POA: Insufficient documentation

## 2023-04-14 LAB — CBC
HCT: 47 % (ref 39.0–52.0)
Hemoglobin: 15.8 g/dL (ref 13.0–17.0)
MCH: 31.2 pg (ref 26.0–34.0)
MCHC: 33.6 g/dL (ref 30.0–36.0)
MCV: 92.7 fL (ref 80.0–100.0)
Platelets: 210 10*3/uL (ref 150–400)
RBC: 5.07 MIL/uL (ref 4.22–5.81)
RDW: 12.3 % (ref 11.5–15.5)
WBC: 12.7 10*3/uL — ABNORMAL HIGH (ref 4.0–10.5)
nRBC: 0 % (ref 0.0–0.2)

## 2023-04-14 LAB — COMPREHENSIVE METABOLIC PANEL
ALT: 19 U/L (ref 0–44)
AST: 26 U/L (ref 15–41)
Albumin: 4.2 g/dL (ref 3.5–5.0)
Alkaline Phosphatase: 44 U/L (ref 38–126)
Anion gap: 7 (ref 5–15)
BUN: 12 mg/dL (ref 6–20)
CO2: 29 mmol/L (ref 22–32)
Calcium: 9.4 mg/dL (ref 8.9–10.3)
Chloride: 101 mmol/L (ref 98–111)
Creatinine, Ser: 0.68 mg/dL (ref 0.61–1.24)
GFR, Estimated: >60 mL/min (ref >60)
Glucose, Bld: 99 mg/dL (ref 70–99)
Potassium: 3.9 mmol/L (ref 3.5–5.1)
Sodium: 137 mmol/L (ref 135–145)
Total Bilirubin: 0.5 mg/dL (ref 0.3–1.2)
Total Protein: 7.4 g/dL (ref 6.5–8.1)

## 2023-04-14 MED ORDER — FLUORESCEIN SODIUM 1 MG OP STRP
ORAL_STRIP | OPHTHALMIC | Status: AC
  Administered 2023-04-14: 1 via OPHTHALMIC
  Filled 2023-04-14: qty 1

## 2023-04-14 MED ORDER — IBUPROFEN 600 MG PO TABS: 600.0000 mg | ORAL_TABLET | Freq: Three times a day (TID) | ORAL | 0 refills | Status: AC | PRN

## 2023-04-14 MED ORDER — LIDOCAINE HCL (PF) 2 % IJ SOLN: 10.0000 mL | Freq: Once | INTRAMUSCULAR | Status: AC

## 2023-04-14 MED ORDER — IOHEXOL 300 MG/ML  SOLN
100.0000 mL | Freq: Once | INTRAMUSCULAR | Status: AC | PRN
  Administered 2023-04-14: 100 mL via INTRAVENOUS

## 2023-04-14 MED ORDER — FLUORESCEIN SODIUM 1 MG OP STRP: 1.0000 | ORAL_STRIP | Freq: Once | OPHTHALMIC | Status: AC

## 2023-04-14 MED ORDER — ONDANSETRON HCL 4 MG/2ML IJ SOLN
4.0000 mg | Freq: Once | INTRAMUSCULAR | Status: AC
  Administered 2023-04-14: 4 mg via INTRAVENOUS
  Filled 2023-04-14: qty 2

## 2023-04-14 MED ORDER — TETRACAINE HCL 0.5 % OP SOLN
2.0000 [drp] | Freq: Once | OPHTHALMIC | Status: AC
  Administered 2023-04-14: 2 [drp] via OPHTHALMIC
  Filled 2023-04-14: qty 4

## 2023-04-14 MED ORDER — KETOROLAC TROMETHAMINE 15 MG/ML IJ SOLN
15.0000 mg | Freq: Once | INTRAMUSCULAR | Status: AC
  Administered 2023-04-14: 15 mg via INTRAVENOUS
  Filled 2023-04-14: qty 1

## 2023-04-14 MED ORDER — LIDOCAINE HCL (PF) 2 % IJ SOLN
INTRAMUSCULAR | Status: AC
  Administered 2023-04-14: 10 mL via INTRADERMAL
  Filled 2023-04-14: qty 5
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
## 2023-04-14 NOTE — Discharge Instructions (Addendum)
Have the stitches to can out in about 5 to 7 days.  Follow-up for the orbital floor fracture.  Traditionally the follow-up is with oral surgery and ophthalmology.

## 2023-04-14 NOTE — ED Notes (Signed)
Suture cart at bedside 

## 2023-04-14 NOTE — ED Notes (Signed)
PA at bedside repairing face lac

## 2023-04-14 NOTE — ED Notes (Signed)
Pt requested more pain medication Informed MD

## 2023-04-14 NOTE — ED Notes (Signed)
Medicated per [facility] PA at bedside for eye exam
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
